# Patient Record
Sex: Male | Born: 2000 | Race: White | Hispanic: No | Marital: Single | Smoking: Never smoker
Health system: Southern US, Community
[De-identification: ages and names within clinical notes are randomized; demographics above are authoritative.]

## PROBLEM LIST (undated history)

## (undated) HISTORY — PX: TYMPANOSTOMY TUBE PLACEMENT: SHX32

---
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
## 2022-09-14 NOTE — Progress Notes (Signed)
New Patient Note  RE: Brandon Schneider MRN: [id number] DOB: 11-16-2000 Date of Office Visit: 09/15/2022  Consult requested by: No ref. provider found Primary care provider: Patient, No Pcp Per  Chief Complaint: Establish Care (Needs refill on auvi-q)  History of Present Illness: I had the pleasure of seeing Brandon Schneider for initial evaluation at the [facility] on 09/15/2022. He is a 22 y.o. male, who is self-referred here for the evaluation of establishing care.  Patient was being followed by [facility] for allergic rhinitis on AIT and food allergies.  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 The reaction occurred at the age of 63-9, after he ate small amount of eggs. Symptoms started within minutes and was in the form of perioral itching. Denies any hives, swelling, wheezing, abdominal pain, diarrhea, vomiting. Denies any associated cofactors such as exertion, infection, NSAID use. The symptoms lasted for 10-15 minutes. He was not evaluated in ED. Last occurrence was 2 years ago. He does have access to epinephrine autoinjector and not needed to use it.   Past work up includes: skin testing was done 2 years ago which was positive to eggs per patient report. Dietary History: patient has been eating other foods including milk, baked eggs, peanut, treenuts, sesame, shellfish, fish, soy, wheat, meats, fruits and vegetables.  He reports reading labels and avoiding straight eggs in diet completely. He tolerates baked egg and baked milk products.   Environmental allergies He reports symptoms of sneezing, nasal congestion, itchy skin, itchy eye (only when around cats). Symptoms have been going on for many years. The symptoms are present all year around with worsening in winter. Other triggers include exposure to cats. Anosmia: no. Headache: no. He has used Claritin prn, OTC eye drops prn, Flonase prn with fair improvement in symptoms. Sinus infections:  none. Previous work  up includes: 2 years ago had skin testing which showed multiple positives per patient. Patient was on AIT for about 1-1.5 yrs with unknown benefit. Stopped due to Covid-19 pandemic.  Previous ENT evaluation: no. Previous sinus imaging: no. History of nasal polyps: no. Last eye exam: 4 months ago. History of reflux: denies.  Assessment and Plan: Brandon Schneider is a 22 y.o. male with: Other adverse food reactions, not elsewhere classified, subsequent encounter Perioral pruritus with stove top egg ingestion. Symptoms resolve within 15 minutes. Tolerates baked egg foods with no issues. Skin testing in the past positive to egg per patient - requesting records. Continue to avoid stove top eggs. Okay to eat items baked with eggs as before.  I have prescribed epinephrine injectable device and demonstrated proper use. For mild symptoms you can take over the counter antihistamines such as Benadryl 1-2 tablets = 25-50mg .and monitor symptoms closely. If symptoms worsen or if you have severe symptoms including breathing issues, throat closure, significant swelling, whole body hives, severe diarrhea and vomiting, lightheadedness then inject epinephrine and seek immediate medical care afterwards. Emergency action plan given.  Other allergic rhinitis Perennial rhino conjunctivitis symptoms which flare in the winter and around cats. Tried Claritin, Flonase and OTC eye drops with good benefit. Skin testing 2 years ago showed multiple positives per patient. Was on AIT for 1-1.5 yrs with unknown benefit. No prior ENT evaluation.  Requesting records.  Start environmental control measures as below. Use over the counter antihistamines such as Zyrtec (cetirizine), Claritin (loratadine), Allegra (fexofenadine), or Xyzal (levocetirizine) daily as needed. May take twice a day during allergy flares. May switch antihistamines every  few months. Use Flonase (fluticasone) nasal spray 1 spray per nostril twice a day as needed for nasal  congestion.  Nasal saline spray (i.e., Simply Saline) or nasal saline lavage (i.e., NeilMed) is recommended as needed and prior to medicated nasal sprays. May use over the counter allergy eye drops as needed. Consider allergy injections for long term control if above medications do not help the symptoms.  Allergic conjunctivitis of both eyes See assessment and plan as above.  History of asthma No inhaler use since age 45. Denies any recent symptoms. Monitor symptoms.  Return in about 1 year (around 09/16/2023).  Meds ordered this encounter  Medications   EPINEPHrine (AUVI-Q) 0.3 mg/0.3 mL IJ SOAJ injection    Sig: Inject 0.3 mg into the muscle as needed for anaphylaxis.    Dispense:  2 each    Refill:  1    [phone]   Lab Orders  No laboratory test(s) ordered today    Other allergy screening: Asthma: yes No inhaler use since age 41.   Medication allergy: no Hymenoptera allergy: no Urticaria: no Eczema:no History of recurrent infections suggestive of immunodeficency: no  Diagnostics: None.  Past Medical History: Patient Active Problem List   Diagnosis Date Noted   Other adverse food reactions, not elsewhere classified, subsequent encounter 09/15/2022   Other allergic rhinitis 09/15/2022   History of asthma 09/15/2022   Allergic conjunctivitis of both eyes 09/15/2022   History reviewed. No pertinent past medical history. Past Surgical History: Past Surgical History:  Procedure Laterality Date   TYMPANOSTOMY TUBE PLACEMENT     Medication List:  Current Outpatient Medications  Medication Sig Dispense Refill   EPINEPHrine (AUVI-Q) 0.3 mg/0.3 mL IJ SOAJ injection Inject 0.3 mg into the muscle as needed for anaphylaxis. 2 each 1   No current facility-administered medications for this visit.   Allergies: Allergies  Allergen Reactions   Eggs Or Egg-Derived Products     Pt states eggs make his throat itch   Social History: Social History   Socioeconomic  History   Marital status: Single    Spouse name: Not on file   Number of children: Not on file   Years of education: Not on file   Highest education level: Not on file  Occupational History   Not on file  Tobacco Use   Smoking status: Never    Passive exposure: Never   Smokeless tobacco: Never  Substance and Sexual Activity   Alcohol use: Never   Drug use: Never   Sexual activity: Not on file  Other Topics Concern   Not on file  Social History Narrative   Not on file   Social Determinants of Health   Financial Resource Strain: Not on file  Food Insecurity: Not on file  Transportation Needs: Not on file  Physical Activity: Not on file  Stress: Not on file  Social Connections: Not on file   Lives in an apartment which is 20 years old. 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 No pertinent family history. Problem                               Relation Asthma  no Eczema                                no Food allergy                          no Allergic rhino conjunctivitis     sister  Review of Systems  Constitutional:  Negative for appetite change, chills, fever and unexpected weight change.  HENT:  Negative for congestion and rhinorrhea.   Eyes:  Negative for itching.  Respiratory:  Negative for cough, chest tightness, shortness of breath and wheezing.   Cardiovascular:  Negative for chest pain.  Gastrointestinal:  Negative for abdominal pain.  Genitourinary:  Negative for difficulty urinating.  Skin:  Negative for rash.  Allergic/Immunologic: Positive for environmental allergies and food allergies.  Neurological:  Negative for headaches.    Objective: BP 120/70   Pulse 97   Temp 98 F (36.7 C)   Resp 20   Ht 5\' 11"   (1.803 m)   Wt 176 lb (79.8 kg)   SpO2 99%   BMI 24.55 kg/m  Body mass index is 24.55 kg/m. Physical Exam Vitals and nursing note reviewed.  Constitutional:      Appearance: Normal appearance. He is well-developed.  HENT:     Head: Normocephalic and atraumatic.     Right Ear: Tympanic membrane and external ear normal.     Left Ear: Tympanic membrane and external ear normal.     Nose: Nose normal.     Mouth/Throat:     Mouth: Mucous membranes are moist.     Pharynx: Oropharynx is clear.  Eyes:     Conjunctiva/sclera: Conjunctivae normal.  Cardiovascular:     Rate and Rhythm: Normal rate and regular rhythm.     Heart sounds: Normal heart sounds. No murmur heard.    No friction rub. No gallop.  Pulmonary:     Effort: Pulmonary effort is normal.     Breath sounds: Normal breath sounds. No wheezing, rhonchi or rales.  Musculoskeletal:     Cervical back: Neck supple.  Skin:    General: Skin is warm.     Findings: No rash.  Neurological:     Mental Status: He is alert and oriented to person, place, and time.  Psychiatric:        Behavior: Behavior normal.   The plan was reviewed with the patient/family, and all questions/concerned were addressed.  It was my pleasure to see Zaion today and participate in his care. Please feel free to contact me with any questions or concerns.  Sincerely,  Rexene Alberts, DO Allergy & Immunology  [facility] office: [place] office: [phone]

## 2022-09-15 ENCOUNTER — Other Ambulatory Visit: Payer: Self-pay

## 2022-09-15 ENCOUNTER — Ambulatory Visit: Payer: 59 | Admitting: Allergy

## 2022-09-15 ENCOUNTER — Encounter: Payer: Self-pay | Admitting: Allergy

## 2022-09-15 VITALS — BP 120/70 | HR 97 | Temp 98.0°F | Resp 20 | Ht 71.0 in | Wt 176.0 lb

## 2022-09-15 DIAGNOSIS — T781XXD Other adverse food reactions, not elsewhere classified, subsequent encounter: Secondary | ICD-10-CM | POA: Diagnosis not present

## 2022-09-15 DIAGNOSIS — J3089 Other allergic rhinitis: Secondary | ICD-10-CM | POA: Diagnosis not present

## 2022-09-15 DIAGNOSIS — Z8709 Personal history of other diseases of the respiratory system: Secondary | ICD-10-CM

## 2022-09-15 DIAGNOSIS — H1013 Acute atopic conjunctivitis, bilateral: Secondary | ICD-10-CM | POA: Diagnosis not present

## 2022-09-15 MED ORDER — EPINEPHRINE 0.3 MG/0.3ML IJ SOAJ: 0.3000 mg | INTRAMUSCULAR | 1 refills | Status: AC | PRN

## 2022-09-15 NOTE — Assessment & Plan Note (Signed)
Perioral pruritus with stove top egg ingestion. Symptoms resolve within 15 minutes. Tolerates baked egg foods with no issues. Skin testing in the past positive to egg per patient - requesting records. Continue to avoid stove top eggs. Okay to eat items baked with eggs as before.  I have prescribed epinephrine injectable device and demonstrated proper use. For mild symptoms you can take over the counter antihistamines such as Benadryl 1-2 tablets = 25-50mg .and monitor symptoms closely. If symptoms worsen or if you have severe symptoms including breathing issues, throat closure, significant swelling, whole body hives, severe diarrhea and vomiting, lightheadedness then inject epinephrine and seek immediate medical care afterwards. Emergency action plan given.

## 2022-09-15 NOTE — Assessment & Plan Note (Signed)
Perennial rhino conjunctivitis symptoms which flare in the winter and around cats. Tried Claritin, Flonase and OTC eye drops with good benefit. Skin testing 2 years ago showed multiple positives per patient. Was on AIT for 1-1.5 yrs with unknown benefit. No prior ENT evaluation.  Requesting records.  Start environmental control measures as below. Use over the counter antihistamines such as Zyrtec (cetirizine), Claritin (loratadine), Allegra (fexofenadine), or Xyzal (levocetirizine) daily as needed. May take twice a day during allergy flares. May switch antihistamines every few months. Use Flonase (fluticasone) nasal spray 1 spray per nostril twice a day as needed for nasal congestion.  Nasal saline spray (i.e., Simply Saline) or nasal saline lavage (i.e., NeilMed) is recommended as needed and prior to medicated nasal sprays. May use over the counter allergy eye drops as needed. Consider allergy injections for long term control if above medications do not help the symptoms.

## 2022-09-15 NOTE — Assessment & Plan Note (Signed)
No inhaler use since age 22. Denies any recent symptoms. Monitor symptoms.

## 2022-09-15 NOTE — Patient Instructions (Signed)
Requesting records from prior allergist.   Food allergy Continue to avoid stove top eggs. Okay to eat items baked with eggs as before.  I have prescribed epinephrine injectable device and demonstrated proper use. For mild symptoms you can take over the counter antihistamines such as Benadryl 1-2 tablets = 25-50mg .and monitor symptoms closely. If symptoms worsen or if you have severe symptoms including breathing issues, throat closure, significant swelling, whole body hives, severe diarrhea and vomiting, lightheadedness then inject epinephrine and seek immediate medical care afterwards. Emergency action plan given.  Environmental allergies Start environmental control measures as below. Use over the counter antihistamines such as Zyrtec (cetirizine), Claritin (loratadine), Allegra (fexofenadine), or Xyzal (levocetirizine) daily as needed. May take twice a day during allergy flares. May switch antihistamines every few months. Use Flonase (fluticasone) nasal spray 1 spray per nostril twice a day as needed for nasal congestion.  Nasal saline spray (i.e., Simply Saline) or nasal saline lavage (i.e., NeilMed) is recommended as needed and prior to medicated nasal sprays. May use over the counter allergy eye drops as needed. Consider allergy injections for long term control if above medications do not help the symptoms.  History of asthma Monitor symptoms.  Follow up in 12 months or sooner if needed.    Reducing Pollen Exposure Pollen seasons: trees (spring), grass (summer) and ragweed/weeds (fall). Keep windows closed in your home and car to lower pollen exposure.  Install air conditioning in the bedroom and throughout the house if possible.  Avoid going out in dry windy days - especially early morning. Pollen counts are highest between 5 - 10 AM and on dry, hot and windy days.  Save outside activities for late afternoon or after a heavy rain, when pollen levels are lower.  Avoid mowing of grass  if you have grass pollen allergy. Be aware that pollen can also be transported indoors on people and pets.  Dry your clothes in an automatic dryer rather than hanging them outside where they might collect pollen.  Rinse hair and eyes before bedtime.  Pet Allergen Avoidance: Contrary to popular opinion, there are no "hypoallergenic" breeds of dogs or cats. That is because people are not allergic to an animal's hair, but to an allergen found in the animal's saliva, dander (dead skin flakes) or urine. Pet allergy symptoms typically occur within minutes. For some people, symptoms can build up and become most severe 8 to 12 hours after contact with the animal. People with severe allergies can experience reactions in public places if dander has been transported on the pet owners' clothing. Keeping an animal outdoors is only a partial solution, since homes with pets in the yard still have higher concentrations of animal allergens. Before getting a pet, ask your allergist to determine if you are allergic to animals. If your pet is already considered part of your family, try to minimize contact and keep the pet out of the bedroom and other rooms where you spend a great deal of time. As with dust mites, vacuum carpets often or replace carpet with a hardwood floor, tile or linoleum. High-efficiency particulate air (HEPA) cleaners can reduce allergen levels over time. While dander and saliva are the source of cat and dog allergens, urine is the source of allergens from rabbits, hamsters, mice and [place] pigs; so ask a non-allergic family member to clean the animal's cage. If you have a pet allergy, talk to your allergist about the potential for allergy immunotherapy (allergy shots). This strategy can often provide long-term relief.

## 2022-09-29 ENCOUNTER — Encounter: Payer: Self-pay | Admitting: Allergy

## 2022-09-29 NOTE — Progress Notes (Signed)
Reviewed notes from Louise. Date of service: multiple. See scanned notes for full documentation. 12/15/2017 skin testing positive to trees, grass, weed, ragweed, mold, dust mites, cockroach, dog, cat, feathers, horse, mouse. Positive to egg.

## 2023-09-16 ENCOUNTER — Ambulatory Visit: Payer: 59 | Admitting: Allergy

## 2023-09-16 NOTE — Progress Notes (Deleted)
Follow Up Note  RE: Brandon Schneider MRN: [id number] DOB: [birth year] Date of Office Visit: 09/16/2023  Referring provider: No ref. provider found Primary care provider: Patient, No Pcp Per  Chief Complaint: No chief complaint on file.  History of Present Illness: I had the pleasure of seeing Brandon Schneider for a follow up visit [facility] on 09/16/2023. He is a 23 y.o. male, who is being followed for adverse food reaction, allergic rhinoconjunctivitis and history of asthma. His previous allergy office visit was on 09/15/2022 with Dr. Selena Batten. Today is a regular follow up visit.  Discussed the use of AI scribe software for clinical note transcription with the patient, who gave verbal consent to proceed.  History of Present Illness            12/15/2017 skin testing positive to trees, grass, weed, ragweed, mold, dust mites, cockroach, dog, cat, feathers, horse, mouse. Positive to egg.  Assessment and Plan: Brandon Schneider is a 23 y.o. male with: Other adverse food reactions, not elsewhere classified, subsequent encounter Perioral pruritus with stove top egg ingestion. Symptoms resolve within 15 minutes. Tolerates baked egg foods with no issues. Skin testing in the past positive to egg per patient - requesting records. Continue to avoid stove top eggs. Okay to eat items baked with eggs as before.  I have prescribed epinephrine injectable device and demonstrated proper use. For mild symptoms you can take over the counter antihistamines such as Benadryl 1-2 tablets = 25-50mg .and monitor symptoms closely. If symptoms worsen or if you have severe symptoms including breathing issues, throat closure, significant swelling, whole body hives, severe diarrhea and vomiting, lightheadedness then inject epinephrine and seek immediate medical care afterwards. Emergency action plan given.   Other allergic rhinitis Perennial rhino conjunctivitis symptoms which flare in the winter and around cats.  Tried Claritin, Flonase and OTC eye drops with good benefit. Skin testing 2 years ago showed multiple positives per patient. Was on AIT for 1-1.5 yrs with unknown benefit. No prior ENT evaluation.  Requesting records.  Start environmental control measures as below. Use over the counter antihistamines such as Zyrtec (cetirizine), Claritin (loratadine), Allegra (fexofenadine), or Xyzal (levocetirizine) daily as needed. May take twice a day during allergy flares. May switch antihistamines every few months. Use Flonase (fluticasone) nasal spray 1 spray per nostril twice a day as needed for nasal congestion.  Nasal saline spray (i.e., Simply Saline) or nasal saline lavage (i.e., NeilMed) is recommended as needed and prior to medicated nasal sprays. May use over the counter allergy eye drops as needed. Consider allergy injections for long term control if above medications do not help the symptoms.   Allergic conjunctivitis of both eyes See assessment and plan as above.   History of asthma No inhaler use since age 66. Denies any recent symptoms. Monitor symptoms. Assessment and Plan              No follow-ups on file.  No orders of the defined types were placed in this encounter.  Lab Orders  No laboratory test(s) ordered today    Diagnostics: Spirometry:  Tracings reviewed. His effort: {Blank single:19197::"Good reproducible efforts.","It was hard to get consistent efforts and there is a question as to whether this reflects a maximal maneuver.","Poor effort, data can not be interpreted."} FVC: ***L FEV1: ***L, ***% predicted FEV1/FVC ratio: ***% Interpretation: {Blank single:19197::"Spirometry consistent with mild obstructive disease","Spirometry consistent with moderate obstructive disease","Spirometry consistent with severe obstructive disease","Spirometry consistent with possible restrictive disease","Spirometry consistent  with mixed obstructive and restrictive disease","Spirometry  uninterpretable due to technique","Spirometry consistent with normal pattern","No overt abnormalities noted given today's efforts"}.  Please see scanned spirometry results for details.  Skin Testing: {Blank single:19197::"Select foods","Environmental allergy panel","Environmental allergy panel and select foods","Food allergy panel","None","Deferred due to recent antihistamines use"}. *** Results discussed with patient/family.   Medication List:  Current Outpatient Medications  Medication Sig Dispense Refill   EPINEPHrine (AUVI-Q) 0.3 mg/0.3 mL IJ SOAJ injection Inject 0.3 mg into the muscle as needed for anaphylaxis. 2 each 1   No current facility-administered medications for this visit.   Allergies: Allergies  Allergen Reactions   Egg-Derived Products     Pt states eggs make his throat itch   I reviewed his past medical history, social history, family history, and environmental history and no significant changes have been reported from his previous visit.  Review of Systems  Constitutional:  Negative for appetite change, chills, fever and unexpected weight change.  HENT:  Negative for congestion and rhinorrhea.   Eyes:  Negative for itching.  Respiratory:  Negative for cough, chest tightness, shortness of breath and wheezing.   Cardiovascular:  Negative for chest pain.  Gastrointestinal:  Negative for abdominal pain.  Genitourinary:  Negative for difficulty urinating.  Skin:  Negative for rash.  Allergic/Immunologic: Positive for environmental allergies and food allergies.  Neurological:  Negative for headaches.    Objective: There were no vitals taken for this visit. There is no height or weight on file to calculate BMI. Physical Exam Vitals and nursing note reviewed.  Constitutional:      Appearance: Normal appearance. He is well-developed.  HENT:     Head: Normocephalic and atraumatic.     Right Ear: Tympanic membrane and external ear normal.     Left Ear: Tympanic  membrane and external ear normal.     Nose: Nose normal.     Mouth/Throat:     Mouth: Mucous membranes are moist.     Pharynx: Oropharynx is clear.  Eyes:     Conjunctiva/sclera: Conjunctivae normal.  Cardiovascular:     Rate and Rhythm: Normal rate and regular rhythm.     Heart sounds: Normal heart sounds. No murmur heard.    No friction rub. No gallop.  Pulmonary:     Effort: Pulmonary effort is normal.     Breath sounds: Normal breath sounds. No wheezing, rhonchi or rales.  Musculoskeletal:     Cervical back: Neck supple.  Skin:    General: Skin is warm.     Findings: No rash.  Neurological:     Mental Status: He is alert and oriented to person, place, and time.  Psychiatric:        Behavior: Behavior normal.    Previous notes and tests were reviewed. The plan was reviewed with the patient/family, and all questions/concerned were addressed.  It was my pleasure to see Jar[place] today and participate in his care. Please feel free to contact me with any questions or concerns.  Sincerely,  Wyline Mood, DO Allergy & Immunology  [facility] office: [phone] [facility] office: [phone]
